# Patient Record
Sex: Male | Born: 1955 | Race: White | Hispanic: No | Marital: Married | Smoking: Current some day smoker
Health system: Southern US, Community
[De-identification: ages and names within clinical notes are randomized; demographics above are authoritative.]

## PROBLEM LIST (undated history)

## (undated) DIAGNOSIS — L723 Sebaceous cyst: Secondary | ICD-10-CM

## (undated) DIAGNOSIS — Z789 Other specified health status: Secondary | ICD-10-CM

## (undated) DIAGNOSIS — M25511 Pain in right shoulder: Secondary | ICD-10-CM

---

## 2005-09-19 ENCOUNTER — Emergency Department: Payer: Self-pay | Admitting: Emergency Medicine

## 2005-09-21 ENCOUNTER — Emergency Department: Payer: Self-pay | Admitting: Emergency Medicine

## 2005-09-28 ENCOUNTER — Emergency Department: Payer: Self-pay | Admitting: Emergency Medicine

## 2005-10-02 ENCOUNTER — Emergency Department: Payer: Self-pay | Admitting: Emergency Medicine

## 2008-06-09 ENCOUNTER — Inpatient Hospital Stay: Payer: Self-pay | Admitting: General Surgery

## 2015-01-11 ENCOUNTER — Emergency Department
Admission: EM | Admit: 2015-01-11 | Discharge: 2015-01-11 | Disposition: A | Discharge status: Home / Self Care | Payer: TRICARE For Life (TFL) | Attending: Emergency Medicine | Admitting: Emergency Medicine

## 2015-01-11 ENCOUNTER — Encounter: Payer: Self-pay | Admitting: Medical Oncology

## 2015-01-11 ENCOUNTER — Emergency Department: Payer: TRICARE For Life (TFL)

## 2015-01-11 DIAGNOSIS — I8390 Asymptomatic varicose veins of unspecified lower extremity: Secondary | ICD-10-CM | POA: Insufficient documentation

## 2015-01-11 DIAGNOSIS — Z791 Long term (current) use of non-steroidal anti-inflammatories (NSAID): Secondary | ICD-10-CM | POA: Diagnosis not present

## 2015-01-11 DIAGNOSIS — M222X1 Patellofemoral disorders, right knee: Secondary | ICD-10-CM | POA: Diagnosis not present

## 2015-01-11 DIAGNOSIS — M7121 Synovial cyst of popliteal space [Baker], right knee: Secondary | ICD-10-CM | POA: Insufficient documentation

## 2015-01-11 DIAGNOSIS — M25561 Pain in right knee: Secondary | ICD-10-CM | POA: Diagnosis present

## 2015-01-11 MED ORDER — MELOXICAM 15 MG PO TABS: 15.0000 mg | ORAL_TABLET | Freq: Every day | ORAL | Status: DC

## 2015-01-11 NOTE — Discharge Instructions (Signed)
Arthralgia °Your caregiver has diagnosed you as suffering from an arthralgia. Arthralgia means there is pain in a joint. This can come from many reasons including: °· Bruising the joint which causes soreness (inflammation) in the joint. °· Wear and tear on the joints which occur as we grow older (osteoarthritis). °· Overusing the joint. °· Various forms of arthritis. °· Infections of the joint. °Regardless of the cause of pain in your joint, most of these different pains respond to anti-inflammatory drugs and rest. The exception to this is when a joint is infected, and these cases are treated with antibiotics, if it is a bacterial infection. °HOME CARE INSTRUCTIONS  °· Rest the injured area for as long as directed by your caregiver. Then slowly start using the joint as directed by your caregiver and as the pain allows. Crutches as directed may be useful if the ankles, knees or hips are involved. If the knee was splinted or casted, continue use and care as directed. If an stretchy or elastic wrapping bandage has been applied today, it should be removed and re-applied every 3 to 4 hours. It should not be applied tightly, but firmly enough to keep swelling down. Watch toes and feet for swelling, bluish discoloration, coldness, numbness or excessive pain. If any of these problems (symptoms) occur, remove the ace bandage and re-apply more loosely. If these symptoms persist, contact your caregiver or return to this location. °· For the first 24 hours, keep the injured extremity elevated on pillows while lying down. °· Apply ice for 15-20 minutes to the sore joint every couple hours while awake for the first half day. Then 03-04 times per day for the first 48 hours. Put the ice in a plastic bag and place a towel between the bag of ice and your skin. °· Wear any splinting, casting, elastic bandage applications, or slings as instructed. °· Only take over-the-counter or prescription medicines for pain, discomfort, or fever as  directed by your caregiver. Do not use aspirin immediately after the injury unless instructed by your physician. Aspirin can cause increased bleeding and bruising of the tissues. °· If you were given crutches, continue to use them as instructed and do not resume weight bearing on the sore joint until instructed. °Persistent pain and inability to use the sore joint as directed for more than 2 to 3 days are warning signs indicating that you should see a caregiver for a follow-up visit as soon as possible. Initially, a hairline fracture (break in bone) may not be evident on X-rays. Persistent pain and swelling indicate that further evaluation, non-weight bearing or use of the joint (use of crutches or slings as instructed), or further X-rays are indicated. X-rays may sometimes not show a small fracture until a week or 10 days later. Make a follow-up appointment with your own caregiver or one to whom we have referred you. A radiologist (specialist in reading X-rays) may read your X-rays. Make sure you know how you are to obtain your X-ray results. Do not assume everything is normal if you do not hear from us. °SEEK MEDICAL CARE IF: °Bruising, swelling, or pain increases. °SEEK IMMEDIATE MEDICAL CARE IF:  °· Your fingers or toes are numb or blue. °· The pain is not responding to medications and continues to stay the same or get worse. °· The pain in your joint becomes severe. °· You develop a fever over 102° F (38.9° C). °· It becomes impossible to move or use the joint. °MAKE SURE YOU:  °·   Understand these instructions.  Will watch your condition.  Will get help right away if you are not doing well or get worse. Document Released: 08/10/2005 Document Revised: 11/02/2011 Document Reviewed: 03/28/2008 [facility] Patient Information 2015 [place], [place]. This information is not intended to replace advice given to you by your health care provider. Make sure you discuss any questions you have with your health care  provider.  Compression Stockings Compression stockings are elastic stockings that "compress" your legs. This helps to increase blood flow, decrease swelling, and reduces the chance of getting blood clots in your lower legs. Compression stockings are used:  After surgery.  If you have a history of poor circulation.  If you are prone to blood clots.  If you have varicose veins.  If you sit or are bedridden for long periods of time. WEARING COMPRESSION STOCKINGS  Your compression stockings should be worn as instructed by your caregiver.  Wearing the correct stocking size is important. Your caregiver can help measure and fit you to the correct size.  When wearing your stockings, do not allow the stockings to bunch up. This is especially important around your toes or behind your knees. Keep the stockings as smooth as possible.  Do not roll the stockings downward and leave them rolled down. This can form a restrictive band around your legs and can decrease blood flow.  The stockings should be removed once a day for 1 hour or as instructed by your caregiver. When the stockings are taken off, inspect your legs and feet. Look for:  Open sores.  Red spots.  Puffy areas (swelling).  Anything that does not seem normal. IMPORTANT INFORMATION ABOUT COMPRESSION STOCKINGS  The compression stockings should be clean, dry, and in good condition before you put them on.  Do not put lotion on your legs or feet. This makes it harder to put the stockings on.  Change your stockings immediately if they become wet or soiled.  Do not wear stockings that are ripped or torn.  You may hand-wash or put your stockings in the washing machine. Use cold or warm water with mild detergent. Do not bleach your stockings. They may be air-dried or dried in the dryer on low heat.  If you have pain or have a feeling of "pins and needles" in your feet or legs, you may be wearing stockings that are too tight. Call  your caregiver right away. SEEK IMMEDIATE MEDICAL CARE IF:   You have numbness or tingling in your lower legs that does not get better quickly after the stockings are removed.  Your toes or feet become cold and blue.  You develop open sores or have red spots on your legs that do not go away. MAKE SURE YOU:   Understand these instructions.  Will watch your condition.  Will get help right away if you are not doing well or get worse. Document Released: 06/07/2009 Document Revised: 11/02/2011 Document Reviewed: 06/07/2009 [facility] Patient Information 2015 [place], [place]. This information is not intended to replace advice given to you by your health care provider. Make sure you discuss any questions you have with your health care provider.

## 2015-01-11 NOTE — ED Notes (Signed)
Pt ambulatory to triage with reports that his rt knee began to hurt the beginning of the week, no known injury. Reports pain now in knee and calf.

## 2015-01-11 NOTE — ED Provider Notes (Signed)
[facility] Provider Note  ____________________________________________  Time seen: Approximately 6:00 PM  I have reviewed the triage vital signs and the nursing notes.   HISTORY  Chief Complaint Knee Pain    HPI Michael Paul is a 59 y.o. male patient's complaining of 4 days of right knee pain and swelling and pain to the right calf. Patient stated there is no provocative incident for this complaint. Patient stated his nose increased varicose veins in his lower leg. Patient denies any shortness of breath or any chest pain associated with this complaint. Patient is rating his pain as a 4/10.   History reviewed. No pertinent past medical history.  There are no active problems to display for this patient.   History reviewed. No pertinent past surgical history.  Current Outpatient Rx  Name  Route  Sig  Dispense  Refill  . meloxicam (MOBIC) 15 MG tablet   Oral   Take 1 tablet (15 mg total) by mouth daily.   30 tablet   2     Allergies Review of patient's allergies indicates no known allergies.  No family history on file.  Social History History  Substance Use Topics  . Smoking status: Never Smoker   . Smokeless tobacco: Never Used  . Alcohol Use: Yes    Review of Systems Constitutional: No fever/chills Eyes: No visual changes. ENT: No sore throat. Cardiovascular: Denies chest pain. Respiratory: Denies shortness of breath. Gastrointestinal: No abdominal pain.  No nausea, no vomiting.  No diarrhea.  No constipation. Genitourinary: Negative for dysuria. Musculoskeletal: Negative for back pain. Patient stated he is having some lower patella pain and pain to the right calf. Skin: Negative for rash. Neurological: Negative for headaches, focal weakness or numbness. 10-point ROS otherwise negative.  ____________________________________________   PHYSICAL EXAM:  VITAL SIGNS: ED Triage Vitals  Enc Vitals Group      BP 01/11/15 1711 118/74 mmHg     Pulse Rate 01/11/15 1711 75     Resp 01/11/15 1711 18     Temp 01/11/15 1711 97.7 F (36.5 C)     Temp Source 01/11/15 1711 Oral     SpO2 01/11/15 1711 100 %     Weight 01/11/15 1711 165 lb (74.844 kg)     Height 01/11/15 1711 5\' 11"  (1.803 m)     Head Cir --      Peak Flow --      Pain Score 01/11/15 1712 4     Pain Loc --      Pain Edu? --      Excl. in GC? --     Constitutional: Alert and oriented. Well appearing and in no acute distress. Eyes: Conjunctivae are normal. PERRL. EOMI. Head: Atraumatic. Nose: No congestion/rhinnorhea. Mouth/Throat: Mucous membranes are moist.  Oropharynx non-erythematous. Neck: No stridor.  No cervical deformity no guarding with palpation full nuchal range of motion. Hematological/Lymphatic/Immunilogical: No cervical lymphadenopathy. Cardiovascular: Normal rate, regular rhythm. Grossly normal heart sounds.  Good peripheral circulation. Respiratory: Normal respiratory effort.  No retractions. Lungs CTAB. Gastrointestinal: Soft and nontender. No distention. No abdominal bruits. No CVA tenderness. Musculoskeletal: No lower extremity tenderness nor edema.  No joint effusions. Neurologic:  Normal speech and language. No gross focal neurologic deficits are appreciated. Speech is normal. No gait instability. Skin:  Skin is warm, dry and intact. No rash noted. Psychiatric: Mood and affect are normal. Speech and behavior are normal.  ____________________________________________   LABS (all labs ordered are listed, but only abnormal  results are displayed)  Labs Reviewed - No data to display ____________________________________________  EKG   ____________________________________________  RADIOLOGY  No acute findings on Knee x-ray and No DVT ____________________________________________   PROCEDURES  Procedure(s) performed: None  Critical Care performed:  No  ____________________________________________   INITIAL IMPRESSION / ASSESSMENT AND PLAN / ED COURSE  Pertinent labs & imaging results that were available during my care of the patient were reviewed by me and considered in my medical decision making (see chart for details).  Right knee pain/ right calf pain. ____________________________________________   FINAL CLINICAL IMPRESSION(S) / ED DIAGNOSES  Final diagnoses:  Patella-femoral syndrome, right  Varicose vein of leg  Baker's cyst, right      Michael ReiningRonald K Smith, PA-C 01/11/15 1849  Minna AntisKevin Paduchowski, MD 01/11/15 2138

## 2016-02-07 IMAGING — CR DG KNEE 1-2V*R*
1 series · 2 of 2 positions shown · non-contrast
Comparison: None

CLINICAL DATA: No known injury. Pain in the knee. Pain in medial
side and patellar region. Worse with weight-bearing.

EXAM:
RIGHT KNEE - 1-2 VIEW

[Series 1: ap · 0.17mm/px · 2 of 2 slices shown]
[im 1/2]
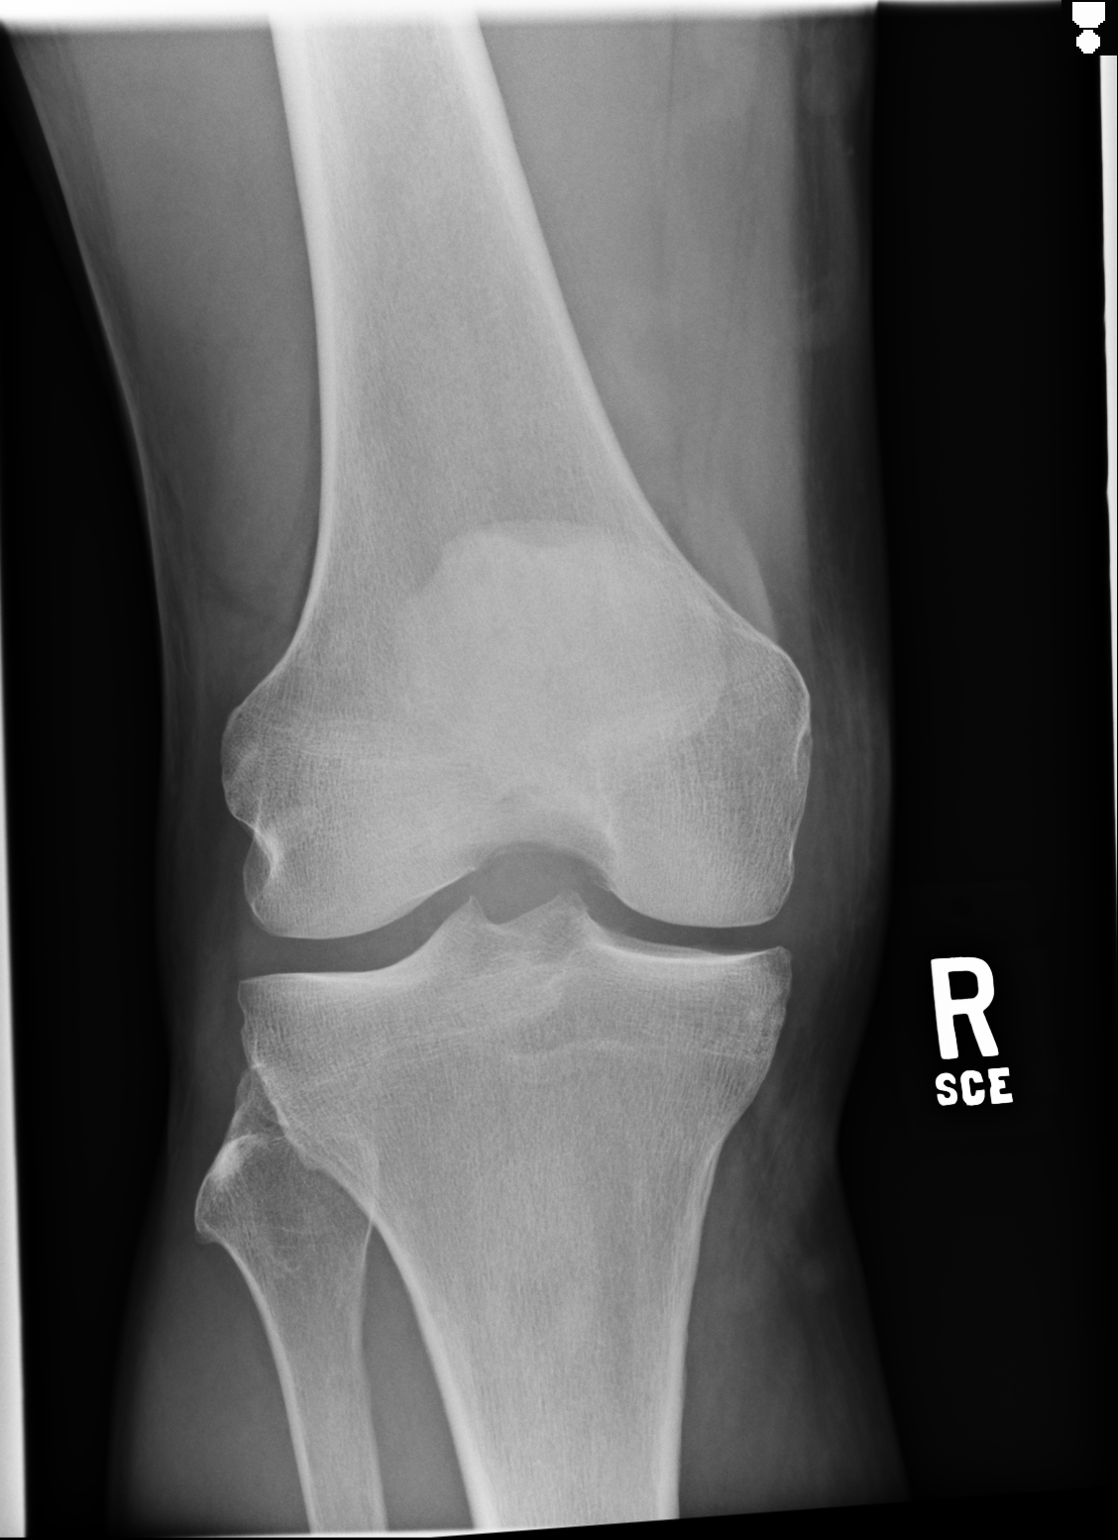
[im 2/2]
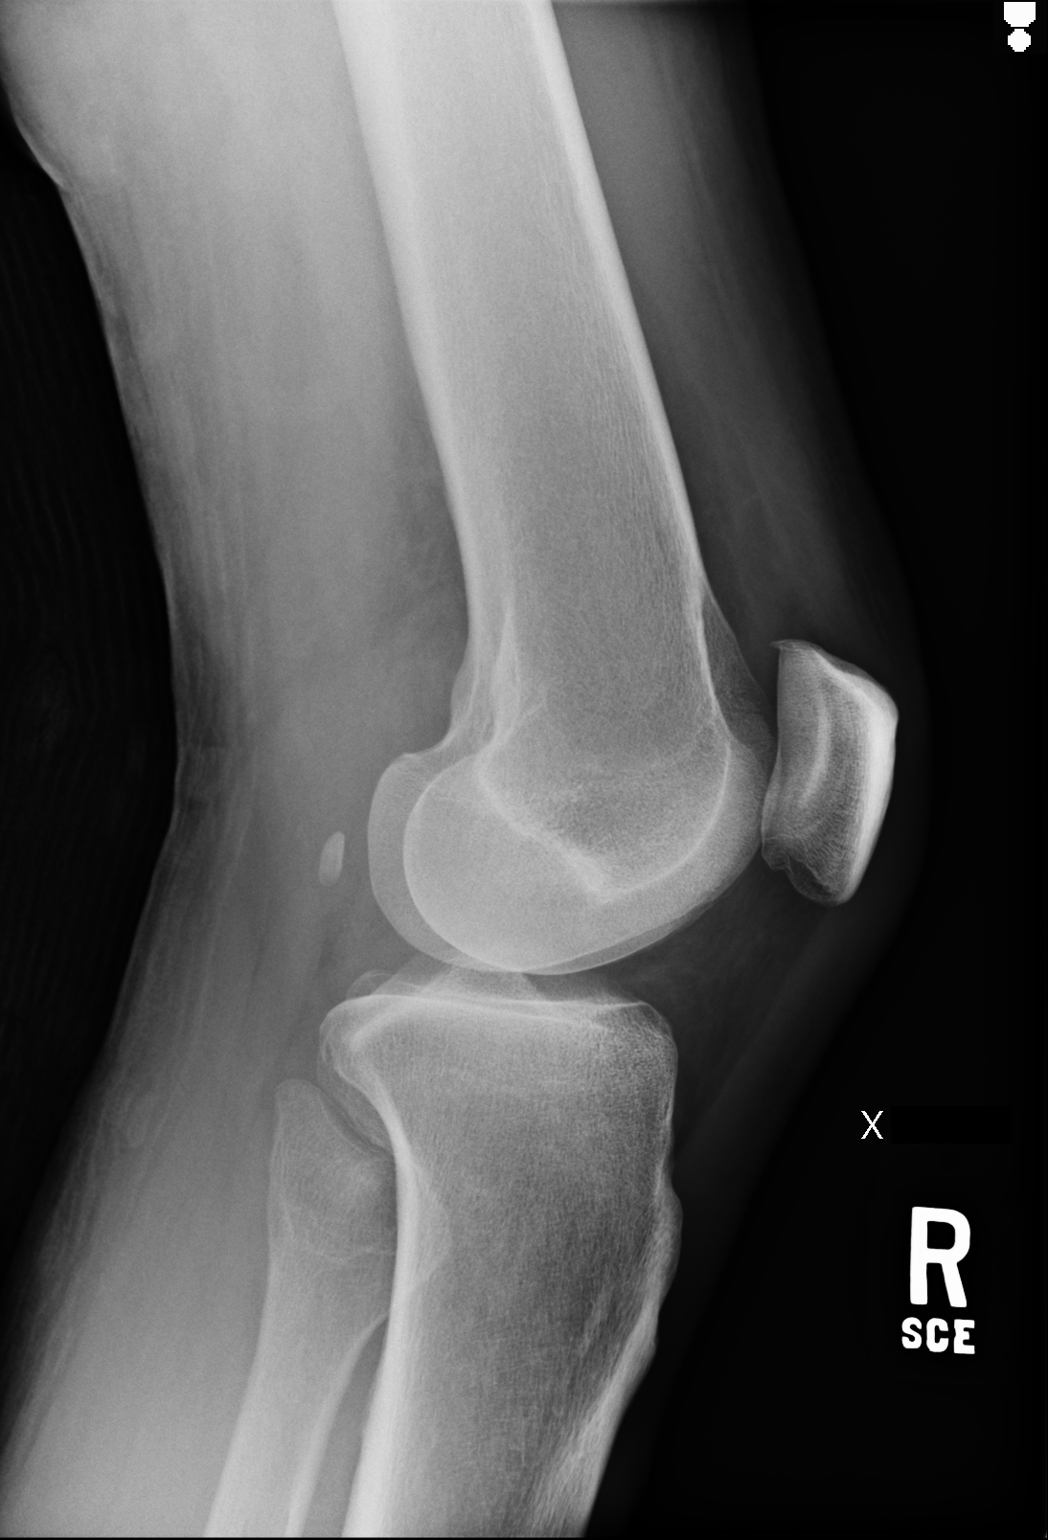

[2 of 2 positions shown; findings below may reference images not displayed]

FINDINGS: There is mild patellofemoral joint compartment narrowing. Trace
joint effusion. No acute fracture or subluxation. No radiopaque
foreign body or soft tissue gas.
IMPRESSION: Trace joint effusion.  No evidence for acute osseous abnormality.

## 2017-08-25 ENCOUNTER — Ambulatory Visit
Admission: EM | Admit: 2017-08-25 | Discharge: 2017-08-25 | Disposition: A | Discharge status: Home / Self Care | Payer: TRICARE For Life (TFL) | Attending: Family Medicine | Admitting: Family Medicine

## 2017-08-25 ENCOUNTER — Other Ambulatory Visit: Payer: Self-pay

## 2017-08-25 ENCOUNTER — Encounter: Payer: Self-pay | Admitting: Emergency Medicine

## 2017-08-25 DIAGNOSIS — J029 Acute pharyngitis, unspecified: Secondary | ICD-10-CM | POA: Diagnosis not present

## 2017-08-25 DIAGNOSIS — R69 Illness, unspecified: Secondary | ICD-10-CM

## 2017-08-25 DIAGNOSIS — R509 Fever, unspecified: Secondary | ICD-10-CM

## 2017-08-25 DIAGNOSIS — M791 Myalgia, unspecified site: Secondary | ICD-10-CM | POA: Diagnosis not present

## 2017-08-25 DIAGNOSIS — R05 Cough: Secondary | ICD-10-CM

## 2017-08-25 DIAGNOSIS — J111 Influenza due to unidentified influenza virus with other respiratory manifestations: Secondary | ICD-10-CM

## 2017-08-25 MED ORDER — OSELTAMIVIR PHOSPHATE 75 MG PO CAPS: 75.0000 mg | ORAL_CAPSULE | Freq: Two times a day (BID) | ORAL | 0 refills | Status: DC

## 2017-08-25 MED ORDER — BENZONATATE 100 MG PO CAPS: 100.0000 mg | ORAL_CAPSULE | Freq: Three times a day (TID) | ORAL | 0 refills | Status: AC | PRN

## 2017-08-25 NOTE — ED Provider Notes (Signed)
[facility] ____________________________________________  Time seen: Approximately 12:05 PM  I have reviewed the triage vital signs and the nursing notes.   HISTORY  Chief Complaint Cough   HPI Michael Paul is a 62 y.o. male presenting for evaluation of nasal congestion, throat irritation, cough that happened quick onset yesterday evening with accompanying body aches and feeling like he had a fever.  States did take some Tylenol last night, none today.  States he has felt like he has had a fever.  Denies known sick contacts.  Has not taken any other over-the-counter medications for the same complaints.  Reports continues to eat and drink well.  Denies other aggravating or alleviating factors.  Reports otherwise feels well.  Denies chest pain, shortness of breath, abdominal pain,or rash. Denies recent sickness. Denies recent antibiotic use.    History reviewed. No pertinent past medical history. Denies   There are no active problems to display for this patient.   History reviewed. No pertinent surgical history.   No current facility-administered medications for this encounter.   Current Outpatient Medications:  .  benzonatate (TESSALON PERLES) 100 MG capsule, Take 1 capsule (100 mg total) by mouth 3 (three) times daily as needed for cough., Disp: 15 capsule, Rfl: 0 .  oseltamivir (TAMIFLU) 75 MG capsule, Take 1 capsule (75 mg total) by mouth every 12 (twelve) hours., Disp: 10 capsule, Rfl: 0  Allergies Patient has no known allergies.  History reviewed. No pertinent family history.  Social History Social History   Tobacco Use  . Smoking status: Never Smoker  . Smokeless tobacco: Never Used  Substance Use Topics  . Alcohol use: Yes  . Drug use: Not on file    Review of Systems Constitutional: As above.  Eyes: No visual changes. ENT: As above.  Cardiovascular: Denies chest pain. Respiratory: Denies shortness of breath. Gastrointestinal: No  abdominal pain.  Musculoskeletal: Negative for back pain. Skin: Negative for rash.   ____________________________________________   PHYSICAL EXAM:  VITAL SIGNS: ED Triage Vitals  Enc Vitals Group     BP 08/25/17 1145 119/80     Pulse Rate 08/25/17 1145 68     Resp 08/25/17 1145 16     Temp 08/25/17 1145 98.7 F (37.1 C)     Temp Source 08/25/17 1145 Oral     SpO2 08/25/17 1145 99 %     Weight 08/25/17 1141 175 lb (79.4 kg)     Height 08/25/17 1141 5\' 11"  (1.803 m)     Head Circumference --      Peak Flow --      Pain Score 08/25/17 1142 4     Pain Loc --      Pain Edu? --      Excl. in GC? --    Constitutional: Alert and oriented. Well appearing and in no acute distress. Eyes: Conjunctivae are normal. Head: Atraumatic. No sinus tenderness to palpation. No swelling. No erythema.  Ears: no erythema, normal TMs bilaterally.   Nose:Nasal congestion with clear rhinorrhea  Mouth/Throat: Mucous membranes are moist. No pharyngeal erythema. No tonsillar swelling or exudate.  Neck: No stridor.  No cervical spine tenderness to palpation. Hematological/Lymphatic/Immunilogical: No cervical lymphadenopathy. Cardiovascular: Normal rate, regular rhythm. Grossly normal heart sounds.  Good peripheral circulation. Respiratory: Normal respiratory effort.  No retractions. No wheezes, rales or rhonchi. Good air movement.  Musculoskeletal: Ambulatory with steady gait.  Neurologic:  Normal speech and language. No gait instability. Skin:  Skin appears warm, dry and intact. No rash  noted. Psychiatric: Mood and affect are normal. Speech and behavior are normal.  ___________________________________________   LABS (all labs ordered are listed, but only abnormal results are displayed)  Labs Reviewed - No data to display ____________________________________________   PROCEDURES Procedures   INITIAL IMPRESSION / ASSESSMENT AND PLAN / ED COURSE  Pertinent labs & imaging results that were  available during my care of the patient were reviewed by me and considered in my medical decision making (see chart for details).  Well-appearing patient.  No acute distress.  Suspect influenza-like viral illness.  Discussed use of Tamiflu, patient agreed.  Will treat patient with oral Tamiflu and PRN Tessalon Perles.  Encourage rest, fluids, supportive care, over-the-counter decongestants.Discussed indication, risks and benefits of medications with patient.  Discussed follow up with Primary care physician this week. Discussed follow up and return parameters including no resolution or any worsening concerns. Patient verbalized understanding and agreed to plan.   ____________________________________________   FINAL CLINICAL IMPRESSION(S) / ED DIAGNOSES  Final diagnoses:  Influenza-like illness     ED Discharge Orders        Ordered    oseltamivir (TAMIFLU) 75 MG capsule  Every 12 hours     08/25/17 1158    benzonatate (TESSALON PERLES) 100 MG capsule  3 times daily PRN     08/25/17 1158       Note: This dictation was prepared with Dragon dictation along with smaller phrase technology. Any transcriptional errors that result from this process are unintentional.         Renford DillsMiller, Courtney Fenlon, NP 08/25/17 1225

## 2017-08-25 NOTE — Discharge Instructions (Signed)
Take medication as prescribed. Rest. Drink plenty of fluids.  ° °Follow up with your primary care physician this week as needed. Return to Urgent care for new or worsening concerns.  ° °

## 2017-08-25 NOTE — ED Triage Notes (Signed)
Patient c/o runny nose, cough, and congestion that started last night.

## 2021-06-11 ENCOUNTER — Ambulatory Visit
Admission: EM | Admit: 2021-06-11 | Discharge: 2021-06-11 | Disposition: A | Discharge status: Home / Self Care | Attending: Physician Assistant | Admitting: Physician Assistant

## 2021-06-11 ENCOUNTER — Encounter: Payer: Self-pay | Admitting: Emergency Medicine

## 2021-06-11 ENCOUNTER — Other Ambulatory Visit: Payer: Self-pay

## 2021-06-11 DIAGNOSIS — M79622 Pain in left upper arm: Secondary | ICD-10-CM

## 2021-06-11 MED ORDER — PREDNISONE 20 MG PO TABS: 40.0000 mg | ORAL_TABLET | Freq: Every day | ORAL | 0 refills | Status: AC

## 2021-06-11 MED ORDER — CYCLOBENZAPRINE HCL 10 MG PO TABS: 10.0000 mg | ORAL_TABLET | Freq: Two times a day (BID) | ORAL | 0 refills | Status: AC | PRN

## 2021-06-11 NOTE — ED Provider Notes (Signed)
[name]    CSN: [id number] Arrival date & time: 06/11/21  1158      History   Chief Complaint Chief Complaint  Patient presents with   Shoulder Pain    HPI Michael Paul is a 65 y.o. male.   Patient here today for evaluation of left upper arm pain that started yesterday.  He states that after working he noticed he had some pain when he woke this morning pain was worse.  Today he is having difficulty elevating his arm laterally due to pain.  He has not taken any medication for symptoms.  He denies any known injury.  The history is provided by the patient.  Shoulder Pain Associated symptoms: no fever    History reviewed. No pertinent past medical history.  There are no problems to display for this patient.   History reviewed. No pertinent surgical history.     Home Medications    Prior to Admission medications   Medication Sig Start Date End Date Taking? Authorizing Provider  cyclobenzaprine (FLEXERIL) 10 MG tablet Take 1 tablet (10 mg total) by mouth 2 (two) times daily as needed for muscle spasms. 06/11/21  Yes Tomi Bamberger, PA-C  predniSONE (DELTASONE) 20 MG tablet Take 2 tablets (40 mg total) by mouth daily with breakfast for 5 days. 06/11/21 06/16/21 Yes Tomi Bamberger, PA-C  benzonatate (TESSALON PERLES) 100 MG capsule Take 1 capsule (100 mg total) by mouth 3 (three) times daily as needed for cough. 08/25/17   Renford Dills, NP  oseltamivir (TAMIFLU) 75 MG capsule Take 1 capsule (75 mg total) by mouth every 12 (twelve) hours. 08/25/17   Renford Dills, NP    Family History History reviewed. No pertinent family history.  Social History Social History   Tobacco Use   Smoking status: Never   Smokeless tobacco: Never  Vaping Use   Vaping Use: Never used  Substance Use Topics   Alcohol use: Yes   Drug use: Never     Allergies   Patient has no known allergies.   Review of Systems Review of Systems  Constitutional:  Negative  for chills and fever.  Eyes:  Negative for discharge and redness.  Respiratory:  Negative for shortness of breath.   Musculoskeletal:  Positive for myalgias. Negative for arthralgias.  Neurological:  Negative for numbness.    Physical Exam Triage Vital Signs ED Triage Vitals  Enc Vitals Group     BP 06/11/21 1359 123/81     Pulse Rate 06/11/21 1359 67     Resp --      Temp 06/11/21 1359 97.9 F (36.6 C)     Temp Source 06/11/21 1359 Oral     SpO2 06/11/21 1359 97 %     Weight 06/11/21 1400 165 lb (74.8 kg)     Height 06/11/21 1400 5\' 11"  (1.803 m)     Head Circumference --      Peak Flow --      Pain Score 06/11/21 1400 8     Pain Loc --      Pain Edu? --      Excl. in GC? --    No data found.  Updated Vital Signs BP 123/81 (BP Location: Left Arm)   Pulse 67   Temp 97.9 F (36.6 C) (Oral)   Ht 5\' 11"  (1.803 m)   Wt 165 lb (74.8 kg)   SpO2 97%   BMI 23.01 kg/m     Physical Exam Vitals and nursing note  reviewed.  Constitutional:      General: He is not in acute distress.    Appearance: Normal appearance. He is not ill-appearing.  HENT:     Head: Normocephalic and atraumatic.  Eyes:     Conjunctiva/sclera: Conjunctivae normal.  Cardiovascular:     Rate and Rhythm: Normal rate.  Pulmonary:     Effort: Pulmonary effort is normal.  Musculoskeletal:     Comments: Mild tenderness palpation to the lateral left upper arm.  Decreased range of motion with abduction due to pain.  Skin:    General: Skin is warm and dry.  Neurological:     Mental Status: He is alert.  Psychiatric:        Mood and Affect: Mood normal.        Behavior: Behavior normal.     UC Treatments / Results  Labs (all labs ordered are listed, but only abnormal results are displayed) Labs Reviewed - No data to display  EKG   Radiology No results found.  Procedures Procedures (including critical care time)  Medications Ordered in UC Medications - No data to display  Initial  Impression / Assessment and Plan / UC Course  I have reviewed the triage vital signs and the nursing notes.  Pertinent labs & imaging results that were available during my care of the patient were reviewed by me and considered in my medical decision making (see chart for details).  Given lack of injury low suspicion for bony abnormality or fracture and x-ray deferred at this time.  We will treat with steroid and muscle relaxer, and information provided for Ortho follow-up should symptoms not improve over the next week.  Final Clinical Impressions(s) / UC Diagnoses   Final diagnoses:  Left upper arm pain     Discharge Instructions      Use muscle relaxer with caution- may cause drowsiness. Follow up with any further concerns.      ED Prescriptions     Medication Sig Dispense Auth. Provider   predniSONE (DELTASONE) 20 MG tablet Take 2 tablets (40 mg total) by mouth daily with breakfast for 5 days. 10 tablet Erma Pinto F, PA-C   cyclobenzaprine (FLEXERIL) 10 MG tablet Take 1 tablet (10 mg total) by mouth 2 (two) times daily as needed for muscle spasms. 20 tablet Tomi Bamberger, PA-C      PDMP not reviewed this encounter.   Tomi Bamberger, PA-C 06/11/21 1513

## 2021-06-11 NOTE — Discharge Instructions (Addendum)
Use muscle relaxer with caution- may cause drowsiness. Follow up with any further concerns.

## 2021-06-11 NOTE — ED Triage Notes (Signed)
Patient c/o left shoulder pain x 1 day.  He noticed after working yesterday, he began having pain and only can lift his left arm up halfway.  No apparent injury.  Denies any pain meds.

## 2024-04-26 NOTE — Progress Notes (Signed)
 BRAXON SUDER is a  68 y.o. male who presents for  CHIEF COMPLAINT Chief Complaint  Patient presents with  . Establish Care    Subjective: History of Present Illness  Pt in NAD. Here to establish care. On no meds with no significant PMH. Has not had a checkup in >10 years. Weight stable. Smokes on occasional. Feels well. Some left foot pain and right shoulder pain. Sleeping well. No fever. Denies Cp or SOB. No palpitations. No change in bowels or bladder.    No past medical history on file. There is no problem list on file for this patient.   History reviewed. No pertinent surgical history.  No current outpatient medications on file.  Patient has no known allergies.  Social History   Socioeconomic History  . Marital status: Married  Tobacco Use  . Smoking status: Some Days    Types: Cigarettes  . Smokeless tobacco: Never  Vaping Use  . Vaping status: Never Used  Substance and Sexual Activity  . Alcohol use: Yes  . Drug use: Defer   Social Drivers of Health   Financial Resource Strain: Low Risk  (04/26/2024)   Overall Financial Resource Strain (CARDIA)   . Difficulty of Paying Living Expenses: Not hard at all  Food Insecurity: No Food Insecurity (04/26/2024)   Hunger Vital Sign   . Worried About Programme researcher, broadcasting/film/video in the Last Year: Never true   . Ran Out of Food in the Last Year: Never true  Transportation Needs: No Transportation Needs (04/26/2024)   PRAPARE - Transportation   . Lack of Transportation (Medical): No   . Lack of Transportation (Non-Medical): No  Housing Stability: Low Risk  (04/26/2024)   Housing Stability Vital Sign   . Unable to Pay for Housing in the Last Year: No   . Number of Times Moved in the Last Year: 0   . Homeless in the Last Year: No    No family history on file.  A comprehensive ROS was negative except for HPI  PE: BP 130/76   Pulse 69   Ht 180.3 cm (5' 11)   Wt 77.6 kg (171 lb)   SpO2 98%   BMI 23.85 kg/m  General. Alert  oriented x3  Skin. No suspicious lesions or moles. Bony lesion noted mid-forehead Eyes. Sclera and conjunctiva clear; pupils equal round and reactive to light and accommodation; extraocular movements intact Ears. External normal; canals clear; tympanic membranes normal Nose. Mucosa healthy without drainage or ulceration Oropharynx. No suspicious lesions Neck. No swelling, masses, stiffness, pain, limited movement, carotid pulses normal bilaterally, thyroid normal size, no masses palpated.  No bruits Lungs. Respirations unlabored; clear to auscultation bilaterally Back. No spinal deformity Cardiovascular. Heart regular rate and rhythm without murmurs, gallops, or rubs Abdomen. Soft; non tender; non distended; normoactive bowel sounds; no masses or organomegaly Lymph Nodes. No significant cervical, supraclavicular, axillary or inguinal lymphadenopathy noted Musculoskeletal. No deformities; no active joint inflammation Extremities. Normal, no edema Pulses. Dorsalis pedis palpable and symmetric bilaterally Neurologic. Alert and oriented; speech intact; face symmetrical; moves all extremities well  Goals     . * Maintain health/healthy lifestyle (pt-stated)        No visits with results within 1 Year(s) from this visit.  Latest known visit with results is:  Office Visit on 07/09/2019  Component Date Value Ref Range Status  . SARS-COV-2, NAA - [facility] 07/09/2019 Detected (!)  Not Detected Final   DIAGNOSIS: Annual physical exam  (primary encounter diagnosis)  Depression screening (Z13.31) Plan: Depression Screen -(PHQ- 2/9, BDI)   PLAN: Labs today. CXR and skull films today. Stop smoking. Refer to Ortho for shoulder pain and Podiatry for foot pain. RTC 1 year, sooner if needed     Attestation Statement:   I personally performed the service. (TP)  Reyes JONETTA Costa, MD, MD  *Some images could not be shown.

## 2024-05-08 DIAGNOSIS — N3943 Post-void dribbling: Secondary | ICD-10-CM | POA: Insufficient documentation

## 2024-05-08 DIAGNOSIS — K649 Unspecified hemorrhoids: Secondary | ICD-10-CM | POA: Insufficient documentation

## 2024-05-08 DIAGNOSIS — I8390 Asymptomatic varicose veins of unspecified lower extremity: Secondary | ICD-10-CM | POA: Insufficient documentation

## 2024-05-08 DIAGNOSIS — L720 Epidermal cyst: Secondary | ICD-10-CM | POA: Insufficient documentation

## 2024-05-08 DIAGNOSIS — M129 Arthropathy, unspecified: Secondary | ICD-10-CM | POA: Insufficient documentation

## 2024-05-08 DIAGNOSIS — M25511 Pain in right shoulder: Secondary | ICD-10-CM | POA: Insufficient documentation

## 2024-05-08 DIAGNOSIS — M179 Osteoarthritis of knee, unspecified: Secondary | ICD-10-CM | POA: Insufficient documentation

## 2024-05-08 DIAGNOSIS — M719 Bursopathy, unspecified: Secondary | ICD-10-CM | POA: Insufficient documentation

## 2024-05-08 DIAGNOSIS — D179 Benign lipomatous neoplasm, unspecified: Secondary | ICD-10-CM | POA: Insufficient documentation

## 2024-05-09 ENCOUNTER — Other Ambulatory Visit: Payer: Self-pay

## 2024-05-09 ENCOUNTER — Encounter: Payer: Self-pay | Admitting: Neurosurgery

## 2024-05-09 ENCOUNTER — Ambulatory Visit
Admission: RE | Admit: 2024-05-09 | Discharge: 2024-05-09 | Disposition: A | Discharge status: Home / Self Care | Payer: Self-pay | Source: Ambulatory Visit | Attending: Neurosurgery | Admitting: Neurosurgery

## 2024-05-09 ENCOUNTER — Ambulatory Visit (INDEPENDENT_AMBULATORY_CARE_PROVIDER_SITE_OTHER): Payer: Medicare (Managed Care) | Admitting: Neurosurgery

## 2024-05-09 VITALS — BP 120/82 | HR 59 | Ht 71.0 in | Wt 171.0 lb

## 2024-05-09 DIAGNOSIS — D164 Benign neoplasm of bones of skull and face: Secondary | ICD-10-CM

## 2024-05-09 DIAGNOSIS — Z049 Encounter for examination and observation for unspecified reason: Secondary | ICD-10-CM

## 2024-05-09 DIAGNOSIS — D169 Benign neoplasm of bone and articular cartilage, unspecified: Secondary | ICD-10-CM

## 2024-05-09 NOTE — Progress Notes (Unsigned)
 Assessment : 68 year old healthy gentleman with some minor orthopedic issues who since 2003 has had a swelling on his forehead.  He noticed that at the time when he was working for the [employer].  Recently he went for his first physical in many many years and this was noted.  He expressed to his primary care doctor that he felt that this was getting bigger and it is causing pain.  Touching it is very painful for him and a skull x-ray was ordered which demonstrates this hyperdensity.  Fortunately on the lateral views there is no erosion of the outer table.  He is otherwise healthy and was accompanied by his wife today.  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  I told him that more likely than not this is a benign lesion and often times when they reach a critical size, the irritation of the periosteum can be uncomfortable.  I given the option of continued observation versus resection and he wants to proceed with the latter.  I told him that I would shave this down and make the forehead flat and it would be an outpatient procedure. We talked about the perioperative course and he would go home the same day.  He wants to proceed with this and I will schedule him for it.   Social History   Socioeconomic History   Marital status: Married    Spouse name: Not on file   Number of children: Not on file   Years of education: Not on file   Highest education level: Not on file  Occupational History   Not on file  Tobacco Use   Smoking status: Never   Smokeless tobacco: Never  Vaping Use   Vaping status: Never Used  Substance and Sexual Activity   Alcohol use: Yes   Drug use: Never   Sexual activity: Not on file  Other Topics Concern   Not on file  Social History Narrative   Not on file   Social Drivers of Health    Financial Resource Strain: Low Risk  (05/09/2024)   Received from [facility]   Overall Financial Resource Strain (CARDIA)    Difficulty of Paying Living Expenses: Not hard at all  Food Insecurity: No Food Insecurity (05/09/2024)   Received from [facility]   Hunger Vital Sign    Within the past 12 months, you worried that your food would run out before you got the money to buy more.: Never true    Within the past 12 months, the food you bought just didn't last and you didn't have money to get more.: Never true  Transportation Needs: No Transportation Needs (05/09/2024)   Received from [facility] - Transportation    In the past 12 months, has lack of transportation kept you from medical appointments or from getting medications?: No    Lack of Transportation (Non-Medical): No  Physical Activity: Not on file  Stress: Not on file  Social Connections: Not on file  Intimate Partner Violence: Not on file    History reviewed. No pertinent family history.  No Known Allergies  History reviewed. No pertinent past medical history.  History reviewed. No pertinent surgical history.   Physical Exam HENT:     Head: Normocephalic.  Nose: Nose normal.  Eyes:     Pupils: Pupils are equal, round, and reactive to light.  Cardiovascular:     Rate and Rhythm: Normal rate.  Pulmonary:     Effort: Pulmonary effort is normal.  Abdominal:     General: Abdomen is flat.  Musculoskeletal:     Cervical back: Normal range of motion.  Neurological:     General: No focal deficit present.     Mental Status: He is alert.     GCS: GCS eye subscore is 4. GCS verbal subscore is 5. GCS motor subscore is 6.     Cranial Nerves: Cranial nerves 2-12 are intact.     Sensory: Sensation is intact.     Motor: Motor function is intact.     Coordination: Coordination is intact.     Gait: Gait is intact.     Comments: Osteoma forehead         No results found for this or any previous visit.

## 2024-05-09 NOTE — Patient Instructions (Signed)
 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 [place], [place] [address]- you will find out your arrival time at your PAT.      Pre-op appointment at [facility] Pre-admit Testing: you will receive a call with a date/time for this appointment. If you are scheduled for an in person appointment, Pre-admit Testing is located at Entrance A. You will enter and check in at patient registration. During this appointment, they will advise you which medications you can take the morning of surgery, and which medications you will need to hold for surgery. Labs (such as blood work, EKG) may be done at your pre-op appointment. You are not required to fast for these labs. Should you need to change your pre-op appointment, please call Pre-admit testing at  [phone].    Common restrictions after surgery: Please avoid lifting weight > 5lbs. You should not drive until you are cleared to do so. Do not wash your hair until your staples/ stitches are removed.    How to contact us :  If you have any questions/concerns before or after surgery, you can reach us  at [phone], or you can send a mychart message. We can be reached by phone or mychart 8am-4pm, Monday-Friday.   Registered Nurse/Surgery scheduler:  Tinnie HERO RN-BSN Medical Assistants: Jesslyn DEL CMA Doctors: Dino Sable MD, Nancyann Burns MD

## 2024-05-10 ENCOUNTER — Other Ambulatory Visit: Payer: Self-pay

## 2024-05-10 DIAGNOSIS — D169 Benign neoplasm of bone and articular cartilage, unspecified: Secondary | ICD-10-CM

## 2024-05-30 NOTE — Progress Notes (Signed)
 Surgical Instructions   Your procedure is scheduled on Monday, October 13th. Report to [facility] Main Entrance A at 5:30 A.M., then check in with the Admitting office. Any questions or running late day of surgery: call [phone]  Questions prior to your surgery date: call [phone], Monday-Friday, 8am-4pm. If you experience any cold or flu symptoms such as cough, fever, chills, shortness of breath, etc. between now and your scheduled surgery, please notify us  at the above number.     Remember:  Do not eat after midnight the night before your surgery  You may drink clear liquids until 4:30 the morning of your surgery.   Clear liquids allowed are: Water, Non-Citrus Juices (without pulp), Carbonated Beverages, Clear Tea (no milk, honey, etc.), Black Coffee Only (NO MILK, CREAM OR POWDERED CREAMER of any kind), and Gatorade.    Take these medicines the morning of surgery with A SIP OF WATER: NONE   One week prior to surgery, STOP taking any Aspirin (unless otherwise instructed by your surgeon) Aleve, Naproxen, Ibuprofen, Motrin, Advil, Goody's, BC's, all herbal medications, fish oil, and non-prescription vitamins.                     Do NOT Smoke (Tobacco/Vaping) for 24 hours prior to your procedure.  If you use a CPAP at night, you may bring your mask/headgear for your overnight stay.   You will be asked to remove any contacts, glasses, piercing's, hearing aid's, dentures/partials prior to surgery. Please bring cases for these items if needed.    Patients discharged the day of surgery will not be allowed to drive home, and someone needs to stay with them for 24 hours.  SURGICAL WAITING ROOM VISITATION Patients may have no more than 2 support people in the waiting area - these visitors may rotate.   Pre-op nurse will coordinate an appropriate time for 1 ADULT support person, who may not rotate, to accompany patient in pre-op.  Children under the age of 1 must have an adult  with them who is not the patient and must remain in the main waiting area with an adult.  If the patient needs to stay at the hospital during part of their recovery, the visitor guidelines for inpatient rooms apply.  Please refer to the [facility] website for the visitor guidelines for any additional information.   If you received a COVID test during your pre-op visit  it is requested that you wear a mask when out in public, stay away from anyone that may not be feeling well and notify your surgeon if you develop symptoms. If you have been in contact with anyone that has tested positive in the last 10 days please notify you surgeon.      Pre-operative CHG Bathing Instructions   You can play a key role in reducing the risk of infection after surgery. Your skin needs to be as free of germs as possible. You can reduce the number of germs on your skin by washing with CHG (chlorhexidine gluconate) soap before surgery. CHG is an antiseptic soap that kills germs and continues to kill germs even after washing.   DO NOT use if you have an allergy to chlorhexidine/CHG or antibacterial soaps. If your skin becomes reddened or irritated, stop using the CHG and notify one of our RNs at [phone].              TAKE A SHOWER THE NIGHT BEFORE SURGERY   Please keep in mind the  following:  DO NOT shave, including legs and underarms, 48 hours prior to surgery.   You may shave your face before/day of surgery.  Place clean sheets on your bed the night before surgery Use a clean washcloth (not used since being washed) for shower. DO NOT sleep with pet's night before surgery.  CHG Shower Instructions:  Wash your face and private area with normal soap. If you choose to wash your hair, wash first with your normal shampoo.  After you use shampoo/soap, rinse your hair and body thoroughly to remove shampoo/soap residue.  Turn the water OFF and apply half the bottle of CHG soap to a CLEAN washcloth.  Apply CHG  soap ONLY FROM YOUR NECK DOWN TO YOUR TOES (washing for 3-5 minutes)  DO NOT use CHG soap on face, private areas, open wounds, or sores.  Pay special attention to the area where your surgery is being performed.  If you are having back surgery, having someone wash your back for you may be helpful. Wait 2 minutes after CHG soap is applied, then you may rinse off the CHG soap.  Pat dry with a clean towel  Put on clean pajamas    Additional instructions for the day of surgery: If you choose, you may shower the morning of surgery with an antibacterial soap.  DO NOT APPLY any lotions, deodorants, cologne, or perfumes.   Do not wear jewelry or makeup Do not wear nail polish, gel polish, artificial nails, or any other type of covering on natural nails (fingers and toes) Do not bring valuables to the hospital. [facility] is not responsible for valuables/personal belongings. Put on clean/comfortable clothes.  Please brush your teeth.  Ask your nurse before applying any prescription medications to the skin.

## 2024-05-31 ENCOUNTER — Encounter (HOSPITAL_COMMUNITY): Payer: Self-pay

## 2024-05-31 ENCOUNTER — Other Ambulatory Visit: Payer: Self-pay

## 2024-05-31 ENCOUNTER — Encounter (HOSPITAL_COMMUNITY)
Admission: RE | Admit: 2024-05-31 | Discharge: 2024-05-31 | Disposition: A | Discharge status: Home / Self Care | Payer: Medicare (Managed Care) | Source: Ambulatory Visit | Attending: Neurosurgery | Admitting: Neurosurgery

## 2024-05-31 VITALS — BP 105/82 | HR 65 | Temp 98.2°F | Resp 18 | Ht 71.0 in | Wt 174.5 lb

## 2024-05-31 DIAGNOSIS — Z01818 Encounter for other preprocedural examination: Secondary | ICD-10-CM

## 2024-05-31 DIAGNOSIS — Z01812 Encounter for preprocedural laboratory examination: Secondary | ICD-10-CM | POA: Diagnosis present

## 2024-05-31 HISTORY — DX: Other specified health status: Z78.9

## 2024-05-31 LAB — CBC
HCT: 48.5 % (ref 39.0–52.0)
Hemoglobin: 16.6 g/dL (ref 13.0–17.0)
MCH: 29.2 pg (ref 26.0–34.0)
MCHC: 34.2 g/dL (ref 30.0–36.0)
MCV: 85.2 fL (ref 80.0–100.0)
Platelets: 298 K/uL (ref 150–400)
RBC: 5.69 MIL/uL (ref 4.22–5.81)
RDW: 12 % (ref 11.5–15.5)
WBC: 10.5 K/uL (ref 4.0–10.5)
nRBC: 0 % (ref 0.0–0.2)

## 2024-05-31 NOTE — Progress Notes (Signed)
 PCP - Reyes Costa MD Cardiologist - Denies  PPM/ICD - Denies Device Orders - N/A Rep Notified - N/A  Chest x-ray - N/A EKG - N/A Stress Test - Denies ECHO - Denies Cardiac Cath - Denies  Sleep Study - Denies CPAP - N/A  Non-diabetic  Last dose of GLP1 agonist-  Denies GLP1 instructions: N/A  Blood Thinner Instructions:Denies Aspirin Instructions:Denies  ERAS Protcol - clears until 0430 PRE-SURGERY Ensure or G2- none  COVID TEST- N/A   Anesthesia review: no  Patient denies shortness of breath, fever, cough and chest pain at PAT appointment. Patient denies any respiratory issues at this time.    All instructions explained to the patient, with a verbal understanding of the material. Patient agrees to go over the instructions while at home for a better understanding. Patient also instructed to self quarantine after being tested for COVID-19. The opportunity to ask questions was provided.

## 2024-06-02 NOTE — Progress Notes (Signed)
 Patient was called to be informed that the surgery time for Monday was changed to 17:47 o'clock. Patient was instructed to be at the hospital at 15:45 o'clock and to stop drinking clear liquids at 14:45 o'clock. Patient verbalized understanding.

## 2024-06-05 ENCOUNTER — Other Ambulatory Visit: Payer: Self-pay

## 2024-06-05 ENCOUNTER — Ambulatory Visit (HOSPITAL_COMMUNITY): Payer: Medicare (Managed Care) | Admitting: Anesthesiology

## 2024-06-05 ENCOUNTER — Encounter (HOSPITAL_COMMUNITY)
Admission: RE | Disposition: A | Discharge status: Home / Self Care | Payer: Self-pay | Source: Home / Self Care | Attending: Neurosurgery

## 2024-06-05 ENCOUNTER — Ambulatory Visit (HOSPITAL_COMMUNITY)
Admission: RE | Admit: 2024-06-05 | Discharge: 2024-06-05 | Disposition: A | Discharge status: Home / Self Care | Payer: Medicare (Managed Care) | Attending: Neurosurgery | Admitting: Neurosurgery

## 2024-06-05 ENCOUNTER — Ambulatory Visit (HOSPITAL_BASED_OUTPATIENT_CLINIC_OR_DEPARTMENT_OTHER): Payer: Medicare (Managed Care) | Admitting: Anesthesiology

## 2024-06-05 ENCOUNTER — Encounter (HOSPITAL_COMMUNITY): Payer: Self-pay | Admitting: Neurosurgery

## 2024-06-05 DIAGNOSIS — D164 Benign neoplasm of bones of skull and face: Secondary | ICD-10-CM

## 2024-06-05 DIAGNOSIS — D169 Benign neoplasm of bone and articular cartilage, unspecified: Secondary | ICD-10-CM

## 2024-06-05 LAB — TYPE AND SCREEN
ABO/RH(D): A POS
Antibody Screen: NEGATIVE

## 2024-06-05 SURGERY — EXCISION, LESION, SKULL
Anesthesia: General

## 2024-06-05 MED ORDER — ACETAMINOPHEN 10 MG/ML IV SOLN
INTRAVENOUS | Status: AC
  Filled 2024-06-05: qty 100

## 2024-06-05 MED ORDER — DROPERIDOL 2.5 MG/ML IJ SOLN: 0.6250 mg | Freq: Once | INTRAMUSCULAR | Status: DC | PRN

## 2024-06-05 MED ORDER — FENTANYL CITRATE (PF) 100 MCG/2ML IJ SOLN: 25.0000 ug | INTRAMUSCULAR | Status: DC | PRN

## 2024-06-05 MED ORDER — FENTANYL CITRATE (PF) 250 MCG/5ML IJ SOLN
INTRAMUSCULAR | Status: DC | PRN
  Administered 2024-06-05: 100 ug via INTRAVENOUS
  Administered 2024-06-05: 50 ug via INTRAVENOUS

## 2024-06-05 MED ORDER — CHLORHEXIDINE GLUCONATE 0.12 % MT SOLN
15.0000 mL | Freq: Once | OROMUCOSAL | Status: AC
  Administered 2024-06-05: 15 mL via OROMUCOSAL
  Filled 2024-06-05: qty 15

## 2024-06-05 MED ORDER — PHENYLEPHRINE 80 MCG/ML (10ML) SYRINGE FOR IV PUSH (FOR BLOOD PRESSURE SUPPORT)
PREFILLED_SYRINGE | INTRAVENOUS | Status: AC
Start: 2024-06-05 — End: 2024-06-05
  Filled 2024-06-05: qty 10

## 2024-06-05 MED ORDER — CEFAZOLIN IN SODIUM CHLORIDE 2-0.9 GM/100ML-% IV SOLN
2.0000 g | Freq: Once | INTRAVENOUS | Status: DC
  Filled 2024-06-05: qty 100

## 2024-06-05 MED ORDER — DEXAMETHASONE SOD PHOSPHATE PF 10 MG/ML IJ SOLN
INTRAMUSCULAR | Status: DC | PRN
  Administered 2024-06-05: 10 mg via INTRAVENOUS

## 2024-06-05 MED ORDER — BUTALBITAL-APAP-CAFFEINE 50-325-40 MG PO TABS: 1.0000 | ORAL_TABLET | Freq: Four times a day (QID) | ORAL | 0 refills | Status: AC | PRN

## 2024-06-05 MED ORDER — ONDANSETRON HCL 4 MG/2ML IJ SOLN
INTRAMUSCULAR | Status: DC | PRN
  Administered 2024-06-05: 4 mg via INTRAVENOUS

## 2024-06-05 MED ORDER — CEFAZOLIN SODIUM-DEXTROSE 2-3 GM-%(50ML) IV SOLR
INTRAVENOUS | Status: DC | PRN
  Administered 2024-06-05: 2 g via INTRAVENOUS

## 2024-06-05 MED ORDER — ONDANSETRON HCL 4 MG/2ML IJ SOLN
INTRAMUSCULAR | Status: AC
  Filled 2024-06-05: qty 2

## 2024-06-05 MED ORDER — ORAL CARE MOUTH RINSE: 15.0000 mL | Freq: Once | OROMUCOSAL | Status: AC

## 2024-06-05 MED ORDER — PROPOFOL 10 MG/ML IV BOLUS
INTRAVENOUS | Status: DC | PRN
  Administered 2024-06-05: 150 mg via INTRAVENOUS

## 2024-06-05 MED ORDER — LIDOCAINE-EPINEPHRINE 1 %-1:100000 IJ SOLN
INTRAMUSCULAR | Status: DC | PRN
  Administered 2024-06-05: 2 mL

## 2024-06-05 MED ORDER — ROCURONIUM BROMIDE 10 MG/ML (PF) SYRINGE
PREFILLED_SYRINGE | INTRAVENOUS | Status: AC
  Filled 2024-06-05: qty 10

## 2024-06-05 MED ORDER — FENTANYL CITRATE (PF) 250 MCG/5ML IJ SOLN
INTRAMUSCULAR | Status: AC
  Filled 2024-06-05: qty 5

## 2024-06-05 MED ORDER — EPHEDRINE 5 MG/ML INJ
INTRAVENOUS | Status: AC
  Filled 2024-06-05: qty 5

## 2024-06-05 MED ORDER — PHENYLEPHRINE 80 MCG/ML (10ML) SYRINGE FOR IV PUSH (FOR BLOOD PRESSURE SUPPORT)
PREFILLED_SYRINGE | INTRAVENOUS | Status: DC | PRN
Start: 2024-06-05 — End: 2024-06-05
  Administered 2024-06-05 (×2): 80 ug via INTRAVENOUS

## 2024-06-05 MED ORDER — LIDOCAINE-EPINEPHRINE 1 %-1:100000 IJ SOLN
INTRAMUSCULAR | Status: AC
  Filled 2024-06-05: qty 1

## 2024-06-05 MED ORDER — ACETAMINOPHEN 10 MG/ML IV SOLN
INTRAVENOUS | Status: DC | PRN
  Administered 2024-06-05: 1000 mg via INTRAVENOUS

## 2024-06-05 MED ORDER — HEMOSTATIC AGENTS (NO CHARGE) OPTIME
TOPICAL | Status: DC | PRN
  Administered 2024-06-05: 1 via TOPICAL

## 2024-06-05 MED ORDER — LIDOCAINE 2% (20 MG/ML) 5 ML SYRINGE
INTRAMUSCULAR | Status: AC
Start: 2024-06-05 — End: 2024-06-05
  Filled 2024-06-05: qty 5

## 2024-06-05 MED ORDER — SODIUM CHLORIDE 0.9 % IV SOLN: INTRAVENOUS | Status: DC

## 2024-06-05 MED ORDER — ROCURONIUM BROMIDE 10 MG/ML (PF) SYRINGE
PREFILLED_SYRINGE | INTRAVENOUS | Status: DC | PRN
  Administered 2024-06-05: 50 mg via INTRAVENOUS

## 2024-06-05 MED ORDER — CEFAZOLIN SODIUM-DEXTROSE 2-4 GM/100ML-% IV SOLN
INTRAVENOUS | Status: AC
  Filled 2024-06-05: qty 100

## 2024-06-05 MED ORDER — SUGAMMADEX SODIUM 200 MG/2ML IV SOLN
INTRAVENOUS | Status: DC | PRN
  Administered 2024-06-05: 300 mg via INTRAVENOUS

## 2024-06-05 MED ORDER — BACITRACIN ZINC 500 UNIT/GM EX OINT
TOPICAL_OINTMENT | CUTANEOUS | Status: AC
  Filled 2024-06-05: qty 28.35

## 2024-06-05 MED ORDER — PROPOFOL 10 MG/ML IV BOLUS
INTRAVENOUS | Status: AC
  Filled 2024-06-05: qty 20

## 2024-06-05 MED ORDER — ACETAMINOPHEN 10 MG/ML IV SOLN: 1000.0000 mg | Freq: Once | INTRAVENOUS | Status: DC | PRN

## 2024-06-05 MED ORDER — OXYCODONE HCL 5 MG PO TABS: 5.0000 mg | ORAL_TABLET | Freq: Once | ORAL | Status: DC | PRN

## 2024-06-05 MED ORDER — EPHEDRINE SULFATE-NACL 50-0.9 MG/10ML-% IV SOSY
PREFILLED_SYRINGE | INTRAVENOUS | Status: DC | PRN
  Administered 2024-06-05: 10 mg via INTRAVENOUS

## 2024-06-05 MED ORDER — OXYCODONE HCL 5 MG/5ML PO SOLN: 5.0000 mg | Freq: Once | ORAL | Status: DC | PRN

## 2024-06-05 MED ORDER — LIDOCAINE 2% (20 MG/ML) 5 ML SYRINGE
INTRAMUSCULAR | Status: DC | PRN
  Administered 2024-06-05: 60 mg via INTRAVENOUS

## 2024-06-05 MED ORDER — LACTATED RINGERS IV SOLN: INTRAVENOUS | Status: DC

## 2024-06-05 SURGICAL SUPPLY — 45 items
BAG COUNTER SPONGE SURGICOUNT (BAG) ×1 IMPLANT
BENZOIN TINCTURE PRP APPL 2/3 (GAUZE/BANDAGES/DRESSINGS) ×3 IMPLANT
BUR ACORN 9.0 PRECISION (BURR) ×1 IMPLANT
BUR MATCHSTICK NEURO 3.0 LAGG (BURR) IMPLANT
CANISTER SUCTION 3000ML PPV (SUCTIONS) ×1 IMPLANT
CHLORAPREP W/TINT 26 (MISCELLANEOUS) ×2 IMPLANT
CNTNR URN SCR LID CUP LEK RST (MISCELLANEOUS) ×1 IMPLANT
COMB HAIR BLACK 7 SU STRL (MISCELLANEOUS) ×1 IMPLANT
COVER MAYO STAND STRL (DRAPES) ×1 IMPLANT
DERMABOND ADVANCED .7 DNX12 (GAUZE/BANDAGES/DRESSINGS) IMPLANT
DRAPE HALF SHEET 40X57 (DRAPES) ×2 IMPLANT
DRAPE IMP U-DRAPE 54X76 (DRAPES) ×1 IMPLANT
DRAPE SURG ORHT 6 SPLT 77X108 (DRAPES) ×1 IMPLANT
DRAPE UTILITY 15X26 TOWEL STRL (DRAPES) ×1 IMPLANT
DRSG TEGADERM 4X4.75 (GAUZE/BANDAGES/DRESSINGS) ×3 IMPLANT
DRSG TELFA 3X8 NADH STRL (GAUZE/BANDAGES/DRESSINGS) ×1 IMPLANT
ELECTRODE REM PT RTRN 9FT ADLT (ELECTROSURGICAL) ×1 IMPLANT
GAUZE SPONGE 4X4 12PLY STRL (GAUZE/BANDAGES/DRESSINGS) IMPLANT
GLOVE BIOGEL M SZ8.5 STRL (GLOVE) ×1 IMPLANT
GOWN STRL REUS W/ TWL LRG LVL3 (GOWN DISPOSABLE) IMPLANT
GOWN STRL REUS W/ TWL XL LVL3 (GOWN DISPOSABLE) IMPLANT
GOWN STRL SURGICAL XL XLNG (GOWN DISPOSABLE) ×1 IMPLANT
HEMOSTAT POWDER KIT SURGIFOAM (HEMOSTASIS) IMPLANT
HEMOSTAT SURGICEL 2X14 (HEMOSTASIS) ×1 IMPLANT
KIT BASIN OR (CUSTOM PROCEDURE TRAY) ×1 IMPLANT
MARKER SKIN DUAL TIP RULER LAB (MISCELLANEOUS) ×1 IMPLANT
NDL HYPO 18GX1.5 BLUNT FILL (NEEDLE) ×1 IMPLANT
NDL HYPO 22X1.5 SAFETY MO (MISCELLANEOUS) ×1 IMPLANT
NEEDLE HYPO 18GX1.5 BLUNT FILL (NEEDLE) ×1 IMPLANT
NEEDLE HYPO 22X1.5 SAFETY MO (MISCELLANEOUS) ×1 IMPLANT
PACK LAMINECTOMY NEURO (CUSTOM PROCEDURE TRAY) ×1 IMPLANT
PENCIL BUTTON HOLSTER BLD 10FT (ELECTRODE) ×1 IMPLANT
PIN MAYFIELD SKULL DISP (PIN) IMPLANT
SOL PREP POV-IOD 4OZ 10% (MISCELLANEOUS) ×1 IMPLANT
SOLN 0.9% NACL 1000 ML (IV SOLUTION) ×1 IMPLANT
SOLN 0.9% NACL POUR BTL 1000ML (IV SOLUTION) ×1 IMPLANT
SOLN STERILE WATER 1000 ML (IV SOLUTION) ×1 IMPLANT
SOLN STERILE WATER BTL 1000 ML (IV SOLUTION) ×1 IMPLANT
SPIKE FLUID TRANSFER (MISCELLANEOUS) ×1 IMPLANT
SPONGE SURGIFOAM ABS GEL 100C (HEMOSTASIS) ×1 IMPLANT
STAPLER SKIN PROX 35W (STAPLE) ×1 IMPLANT
SUT PROLENE 5 0 RB 2 (SUTURE) IMPLANT
SUT VIC AB 0 CT2 8-18 (SUTURE) ×1 IMPLANT
TOWEL GREEN STERILE (TOWEL DISPOSABLE) ×1 IMPLANT
TOWEL GREEN STERILE FF (TOWEL DISPOSABLE) ×1 IMPLANT

## 2024-06-05 NOTE — Anesthesia Preprocedure Evaluation (Signed)
 Anesthesia Evaluation  Patient identified by MRN, date of birth, ID band Patient awake    Reviewed: Allergy & Precautions, NPO status , Patient's Chart, lab work & pertinent test results  History of Anesthesia Complications Negative for: history of anesthetic complications  Airway Mallampati: II  TM Distance: >3 FB Neck ROM: Full    Dental  (+) Edentulous Upper, Edentulous Lower   Pulmonary neg sleep apnea, neg COPD, Current SmokerPatient did not abstain from smoking.   Pulmonary exam normal breath sounds clear to auscultation       Cardiovascular Exercise Tolerance: Good METS(-) hypertension(-) CAD and (-) Past MI negative cardio ROS (-) dysrhythmias  Rhythm:Regular Rate:Normal - Systolic murmurs    Neuro/Psych negative neurological ROS  negative psych ROS   GI/Hepatic ,neg GERD  ,,(+)     (-) substance abuse    Endo/Other  neg diabetes    Renal/GU negative Renal ROS     Musculoskeletal  (+) Arthritis ,    Abdominal   Peds  Hematology   Anesthesia Other Findings Past Medical History: No date: Medical history non-contributory  Reproductive/Obstetrics                              Anesthesia Physical Anesthesia Plan  ASA: 2  Anesthesia Plan: General   Post-op Pain Management: Ofirmev IV (intra-op)*   Induction: Intravenous  PONV Risk Score and Plan: 1 and Ondansetron, Dexamethasone and Treatment may vary due to age or medical condition  Airway Management Planned: Oral ETT  Additional Equipment: None  Intra-op Plan:   Post-operative Plan: Extubation in OR  Informed Consent: I have reviewed the patients History and Physical, chart, labs and discussed the procedure including the risks, benefits and alternatives for the proposed anesthesia with the patient or authorized representative who has indicated his/her understanding and acceptance.     Dental advisory given  Plan  Discussed with: CRNA and Surgeon  Anesthesia Plan Comments: (Discussed risks of anesthesia with patient, including PONV, sore throat, lip/dental/eye damage. Rare risks discussed as well, such as cardiorespiratory and neurological sequelae, and allergic reactions. Discussed the role of CRNA in patient's perioperative care. Patient understands. Patient counseled on benefits of smoking cessation, and increased perioperative risks associated with continued smoking. )        Anesthesia Quick Evaluation

## 2024-06-05 NOTE — Op Note (Signed)
 DATE OF SURGERY: 06/05/2024   ATTENDING SURGEON: Dino Sable, MD   ASSISTANT: None   PREOPERATIVE DIAGNOSIS: Skull Osteoma   POSTOPERATIVE DIAGNOSIS: Same    PROCEDURE PERFORMED:  1. Frontal skull osteoma removal  ANESTHESIA: General endotracheal anesthesia.     ESTIMATED BLOOD LOSS, URINE OUTPUT, AND CRYSTALLOIDS:   See anesthesia chart.     COMPLICATIONS: None.     SPECIMENS: Osteoma   DRAINS: None    PREOPERATIVE COURSE:   68 year old gentleman with a small bony trip protuberance on his forehead which over time has gotten bigger.  Patient wanted to have this removed.  Options were discussed with him of doing nothing versus removal and he opted for the latter.  The risks discussed included but not limited to infection, hemorrhage, stroke, paralysis, blindness, speech impairment, seizures, DVT/PE, cardiopulmonary complications and death among others.  He agrees to have the procedure done and requested for us  to proceed.   DESCRIPTION OF PROCEDURE: Patient was brought to the operating room and after general tracheal anesthesia was ensued the osteoma was identified right underneath one of his forehead wrinkles.  He was prepped and draped in usual sterile fashion and the outline was then injected with lidocaine with epinephrine and Afrin.  After using #10 blade an incision was made and the osteoma was identified.  A biopsy was taken and sent for pathology after made using a matchstick drill bit the prominence was drilled down.  After it was flush with the bone copious irrigation was performed and the incision was closed in multiple layers.  At the end of the procedure all counts were complete.

## 2024-06-05 NOTE — H&P (Signed)
  68YO GENTLEMAN WITH A SKULL TUMOR  Past Medical History:  Diagnosis Date   Medical history non-contributory    Past Surgical History:  Procedure Laterality Date   ANKLE SURGERY Right    1977   TONSILLECTOMY     21   WISDOM TOOTH EXTRACTION     Social History   Socioeconomic History   Marital status: Married    Spouse name: Not on file   Number of children: Not on file   Years of education: Not on file   Highest education level: Not on file  Occupational History   Not on file  Tobacco Use   Smoking status: Some Days    Current packs/day: 0.25    Types: Cigarettes   Smokeless tobacco: Never  Vaping Use   Vaping status: Never Used  Substance and Sexual Activity   Alcohol use: Yes    Comment: occasional   Drug use: Never   Sexual activity: Not Currently  Other Topics Concern   Not on file  Social History Narrative   Not on file   Social Drivers of Health   Financial Resource Strain: Low Risk  (05/09/2024)   Received from [facility]   Overall Financial Resource Strain (CARDIA)    Difficulty of Paying Living Expenses: Not hard at all  Food Insecurity: No Food Insecurity (05/09/2024)   Received from [facility]   Hunger Vital Sign    Within the past 12 months, you worried that your food would run out before you got the money to buy more.: Never true    Within the past 12 months, the food you bought just didn't last and you didn't have money to get more.: Never true  Transportation Needs: No Transportation Needs (05/09/2024)   Received from [facility] - Transportation    In the past 12 months, has lack of transportation kept you from medical appointments or from getting medications?: No    Lack of Transportation (Non-Medical): No  Physical Activity: Not on file  Stress: Not on file  Social Connections: Not on file  Intimate Partner Violence: Not on file   History reviewed. No pertinent family  history. No Known Allergies Scheduled Meds: Continuous Infusions:  sodium chloride 10 mL/hr at 06/05/24 1540   ceFAZolin     ceFAZolin     PRN Meds:.ceFAZolin Vitals:   06/05/24 1518  BP: 136/82  Pulse: 62  Resp: 19  Temp: 98.1 F (36.7 C)  SpO2: 100%   Physical Exam HENT:     Head: Normocephalic.     Nose: Nose normal.  Eyes:     Pupils: Pupils are equal, round, and reactive to light.  Cardiovascular:     Rate and Rhythm: Normal rate.  Pulmonary:     Effort: Pulmonary effort is normal.  Abdominal:     General: Abdomen is flat.  Musculoskeletal:     Cervical back: Normal range of motion.  Neurological:     Mental Status: He is alert.   ASSESSMENT: SKULL TUMOR  PLAN: Frontal Craniotomy for Removal of Cranial Osteoma

## 2024-06-05 NOTE — Transfer of Care (Signed)
 Immediate Anesthesia Transfer of Care Note  Patient: Michael Paul  Procedure(s) Performed: EXCISION, LESION, SKULL  Patient Location: PACU  Anesthesia Type:General  Level of Consciousness: awake, alert , oriented, and patient cooperative  Airway & Oxygen Therapy: Patient Spontanous Breathing  Post-op Assessment: Report given to RN and Post -op Vital signs reviewed and stable  Post vital signs: Reviewed and stable  Last Vitals:  Vitals Value Taken Time  BP 126/69 06/05/24 18:33  Temp    Pulse 81 06/05/24 18:36  Resp 21 06/05/24 18:36  SpO2 96 % 06/05/24 18:36  Vitals shown include unfiled device data.  Last Pain:  Vitals:   06/05/24 1535  TempSrc:   PainSc: 0-No pain         Complications: No notable events documented.

## 2024-06-05 NOTE — Anesthesia Procedure Notes (Signed)
 Procedure Name: Intubation Date/Time: 06/05/2024 5:41 PM  Performed by: Genny Gun, CRNAPre-anesthesia Checklist: Patient identified, Emergency Drugs available, Suction available, Patient being monitored and Timeout performed Patient Re-evaluated:Patient Re-evaluated prior to induction Oxygen Delivery Method: Circle system utilized Preoxygenation: Pre-oxygenation with 100% oxygen Induction Type: IV induction Ventilation: Mask ventilation without difficulty and Oral airway inserted - appropriate to patient size Laryngoscope Size: Mac and 4 Grade View: Grade I Tube type: Oral Tube size: 7.5 mm Number of attempts: 1 Placement Confirmation: ETT inserted through vocal cords under direct vision, positive ETCO2 and breath sounds checked- equal and bilateral Secured at: 23 cm Tube secured with: Tape Dental Injury: Teeth and Oropharynx as per pre-operative assessment

## 2024-06-05 NOTE — Anesthesia Postprocedure Evaluation (Signed)
 Anesthesia Post Note  Patient: Michael Paul  Procedure(s) Performed: EXCISION, LESION, SKULL     Patient location during evaluation: PACU Anesthesia Type: General Level of consciousness: awake and alert Pain management: pain level controlled Vital Signs Assessment: post-procedure vital signs reviewed and stable Respiratory status: spontaneous breathing, nonlabored ventilation, respiratory function stable and patient connected to nasal cannula oxygen Cardiovascular status: blood pressure returned to baseline and stable Postop Assessment: no apparent nausea or vomiting Anesthetic complications: no   No notable events documented.  Last Vitals:  Vitals:   06/05/24 1845 06/05/24 1900  BP: 126/83 123/84  Pulse: 66 63  Resp: 14 10  Temp:  36.4 C  SpO2: 98% 94%    Last Pain:  Vitals:   06/05/24 1900  TempSrc:   PainSc: 0-No pain                 Epifanio Lamar BRAVO

## 2024-06-06 ENCOUNTER — Encounter (HOSPITAL_COMMUNITY): Payer: Self-pay | Admitting: Neurosurgery

## 2024-06-07 LAB — SURGICAL PATHOLOGY

## 2024-06-16 ENCOUNTER — Encounter: Payer: Self-pay | Admitting: Neurosurgery

## 2024-06-16 ENCOUNTER — Ambulatory Visit (INDEPENDENT_AMBULATORY_CARE_PROVIDER_SITE_OTHER): Payer: Medicare (Managed Care) | Admitting: Neurosurgery

## 2024-06-16 VITALS — BP 121/76 | HR 67 | Temp 97.8°F | Ht 71.0 in | Wt 173.8 lb

## 2024-06-16 DIAGNOSIS — D169 Benign neoplasm of bone and articular cartilage, unspecified: Secondary | ICD-10-CM

## 2024-06-16 NOTE — Progress Notes (Signed)
 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  He returns and his incision is healing very well.  He does not have any significant swelling only the expected postoperative one.  I shared the pathology report with him and congratulated him with the finding.  Given the fact that this has been resected and his incision is healed well, I recommended that he follows up with me on an as-needed basis and he is comfortable in doing that.  If there is anything I can do for him in the future, he will let me know.

## 2024-07-04 ENCOUNTER — Encounter: Admitting: Neurosurgery

## 2024-07-07 ENCOUNTER — Encounter: Admitting: Neurosurgery

## 2024-07-25 ENCOUNTER — Other Ambulatory Visit: Payer: Self-pay | Admitting: Surgery

## 2024-07-25 DIAGNOSIS — M19011 Primary osteoarthritis, right shoulder: Secondary | ICD-10-CM

## 2024-07-25 DIAGNOSIS — S43101A Unspecified dislocation of right acromioclavicular joint, initial encounter: Secondary | ICD-10-CM

## 2024-07-26 ENCOUNTER — Ambulatory Visit: Admission: RE | Admit: 2024-07-26 | Source: Ambulatory Visit

## 2024-07-31 ENCOUNTER — Ambulatory Visit: Admission: RE | Admit: 2024-07-31 | Discharge: 2024-07-31 | Payer: Medicare (Managed Care) | Attending: Surgery

## 2024-07-31 DIAGNOSIS — S43101A Unspecified dislocation of right acromioclavicular joint, initial encounter: Secondary | ICD-10-CM

## 2024-07-31 DIAGNOSIS — M19011 Primary osteoarthritis, right shoulder: Secondary | ICD-10-CM

## 2024-08-25 ENCOUNTER — Other Ambulatory Visit: Payer: Self-pay | Admitting: Surgery

## 2024-08-25 DIAGNOSIS — M19011 Primary osteoarthritis, right shoulder: Secondary | ICD-10-CM

## 2024-08-25 DIAGNOSIS — S43101A Unspecified dislocation of right acromioclavicular joint, initial encounter: Secondary | ICD-10-CM

## 2024-08-31 ENCOUNTER — Ambulatory Visit
Admission: RE | Admit: 2024-08-31 | Discharge: 2024-08-31 | Disposition: A | Discharge status: Home / Self Care | Source: Ambulatory Visit | Attending: Surgery | Admitting: Surgery

## 2024-08-31 DIAGNOSIS — M19011 Primary osteoarthritis, right shoulder: Secondary | ICD-10-CM | POA: Diagnosis present

## 2024-08-31 DIAGNOSIS — S43101A Unspecified dislocation of right acromioclavicular joint, initial encounter: Secondary | ICD-10-CM | POA: Diagnosis present

## 2024-09-26 ENCOUNTER — Other Ambulatory Visit: Payer: Self-pay | Admitting: Surgery

## 2024-09-26 ENCOUNTER — Other Ambulatory Visit: Payer: Self-pay

## 2024-09-26 ENCOUNTER — Inpatient Hospital Stay
Admission: RE | Admit: 2024-09-26 | Discharge: 2024-09-26 | Disposition: A | Source: Ambulatory Visit | Attending: Surgery

## 2024-09-26 VITALS — BP 105/74 | HR 62 | Temp 97.9°F | Resp 16 | Ht 71.0 in | Wt 179.0 lb

## 2024-09-26 DIAGNOSIS — M25511 Pain in right shoulder: Secondary | ICD-10-CM | POA: Insufficient documentation

## 2024-09-26 DIAGNOSIS — Z01810 Encounter for preprocedural cardiovascular examination: Secondary | ICD-10-CM

## 2024-09-26 DIAGNOSIS — Z01818 Encounter for other preprocedural examination: Secondary | ICD-10-CM | POA: Insufficient documentation

## 2024-09-26 DIAGNOSIS — Z01812 Encounter for preprocedural laboratory examination: Secondary | ICD-10-CM

## 2024-09-26 HISTORY — DX: Pain in right shoulder: M25.511

## 2024-09-26 HISTORY — DX: Sebaceous cyst: L72.3

## 2024-09-26 LAB — BASIC METABOLIC PANEL WITH GFR
Anion gap: 12 (ref 5–15)
BUN: 11 mg/dL (ref 8–23)
CO2: 26 mmol/L (ref 22–32)
Calcium: 9 mg/dL (ref 8.9–10.3)
Chloride: 103 mmol/L (ref 98–111)
Creatinine, Ser: 0.84 mg/dL (ref 0.61–1.24)
GFR, Estimated: >60 mL/min
Glucose, Bld: 75 mg/dL (ref 70–99)
Potassium: 4.2 mmol/L (ref 3.5–5.1)
Sodium: 141 mmol/L (ref 135–145)

## 2024-09-26 LAB — SURGICAL PCR SCREEN
MRSA, PCR: NEGATIVE
Staphylococcus aureus: NEGATIVE

## 2024-09-26 LAB — CBC
HCT: 50.1 % (ref 39.0–52.0)
Hemoglobin: 16.7 g/dL (ref 13.0–17.0)
MCH: 28.8 pg (ref 26.0–34.0)
MCHC: 33.3 g/dL (ref 30.0–36.0)
MCV: 86.4 fL (ref 80.0–100.0)
Platelets: 287 10*3/uL (ref 150–400)
RBC: 5.8 MIL/uL (ref 4.22–5.81)
RDW: 12.3 % (ref 11.5–15.5)
WBC: 7.3 10*3/uL (ref 4.0–10.5)
nRBC: 0 % (ref 0.0–0.2)

## 2024-09-26 LAB — URINALYSIS, ROUTINE W REFLEX MICROSCOPIC
Bilirubin Urine: NEGATIVE
Glucose, UA: NEGATIVE mg/dL
Hgb urine dipstick: NEGATIVE
Ketones, ur: NEGATIVE mg/dL
Leukocytes,Ua: NEGATIVE
Nitrite: NEGATIVE
Protein, ur: NEGATIVE mg/dL
Specific Gravity, Urine: 1.017 (ref 1.005–1.030)
pH: 6 (ref 5.0–8.0)

## 2024-10-03 ENCOUNTER — Ambulatory Visit: Admission: RE | Admit: 2024-10-03 | Admitting: Surgery

## 2024-10-03 ENCOUNTER — Encounter: Admission: RE | Payer: Self-pay | Source: Home / Self Care

## 2024-10-03 ENCOUNTER — Encounter: Payer: Self-pay | Admitting: Urgent Care

## 2024-10-03 SURGERY — ARTHROPLASTY, SHOULDER, TOTAL, REVERSE
Anesthesia: Choice | Site: Shoulder | Laterality: Right
